# Patient Record
Sex: Female | Born: 1975 | Race: Black or African American | Hispanic: No | Marital: Single | Smoking: Never smoker
Health system: Southern US, Community
[De-identification: ages and names within clinical notes are randomized; demographics above are authoritative.]

## PROBLEM LIST (undated history)

## (undated) DIAGNOSIS — F329 Major depressive disorder, single episode, unspecified: Secondary | ICD-10-CM

## (undated) DIAGNOSIS — E079 Disorder of thyroid, unspecified: Secondary | ICD-10-CM

## (undated) DIAGNOSIS — F32A Depression, unspecified: Secondary | ICD-10-CM

## (undated) DIAGNOSIS — E669 Obesity, unspecified: Secondary | ICD-10-CM

## (undated) DIAGNOSIS — E559 Vitamin D deficiency, unspecified: Secondary | ICD-10-CM

## (undated) DIAGNOSIS — I10 Essential (primary) hypertension: Secondary | ICD-10-CM

## (undated) HISTORY — DX: Essential (primary) hypertension: I10

## (undated) HISTORY — DX: Depression, unspecified: F32.A

## (undated) HISTORY — PX: ABDOMINAL HYSTERECTOMY: SHX81

## (undated) HISTORY — DX: Vitamin D deficiency, unspecified: E55.9

## (undated) HISTORY — DX: Disorder of thyroid, unspecified: E07.9

## (undated) HISTORY — DX: Obesity, unspecified: E66.9

---
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
## 2019-07-19 ENCOUNTER — Encounter: Payer: Medicaid Other | Attending: Physician Assistant | Admitting: Dietician

## 2019-07-19 ENCOUNTER — Encounter: Payer: Self-pay | Admitting: Dietician

## 2019-07-19 ENCOUNTER — Other Ambulatory Visit: Payer: Self-pay

## 2019-07-19 DIAGNOSIS — E669 Obesity, unspecified: Secondary | ICD-10-CM | POA: Diagnosis present

## 2019-07-19 NOTE — Patient Instructions (Addendum)
   Aim to drink at least 64 ounces of fluid per day.   Review the Thyroid Health handout from today and be sure to include foods that promote thyroid health, and try to avoid foods that may decrease thyroid health.   If you are hungry in the evening, use the Sleep & Late-Night Foods handout for ideas of snacks to have. Also, use the tips on this sheet to promote better sleep which can help support your immune system, weight management, and overall health!  Avoid using salt and instead use the seasonings handout for low sodium flavor ideas. Avoid fast food, restaurant foods, and packaged foods as much as possible to also decrease salt intake.    Take a vitamin D supplement of 2000 IU every day.

## 2019-07-19 NOTE — Progress Notes (Signed)
Medical Nutrition Therapy  Appt Start Time: 2:05pm    End Time: 3:05pm  Primary concerns today: hypothyroidism, weight management, high blood pressure Referral diagnosis: E66.9- obesity Preferred learning style: no preference indicated Learning readiness: ready   NUTRITION ASSESSMENT   Clinical Medical Hx: obesity, hypothyroidism, vitamin D deficiency, HTN, depression Labs:  TG  160mg /dL (high)  LDL  100mg /dL (high)   T4  (low)  TSH  17 uIU/mL (high)  Vit D  16.1 ng/mL (low)  Notable Signs/Symptoms: puffiness around eyes, tiredness   Lifestyle & Dietary Hx Patient states she is quite stressed due to 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 States she does not sleep well at night, as she wakes up around 3am every night for about 2 hours before going back to sleep. States she does not eat if she is not hungry and often forgets to eat due to this, so typical meal pattern is 2-3 meals/snacks per day. States she was told to take vitamin D due to her deficiency and asked about appropriate supplements for this. States she is concerned about her weight and high blood pressure as well. Takes black seed oil, magnesium, zinc. States she has had issues getting labs drawn at the past few attempts due to dehydration. States she is always thirsty.   Estimated daily fluid intake: maybe 48 oz  Supplements: black seed oil, magnesium, zinc Sleep: "okay," 7 hours, but interrupted  Stress / self-care: very stressed, thinks this is root of many problems Current average weekly physical activity: ADLs  24-Hr Dietary Recall First Meal: cereal  Snack: - Second Meal: - Snack: chips Third Meal:  tuna fish sandwich Snack: - Beverages: water, cranberry juice, lemon water  Estimated Energy Needs Calories: 1800 Carbohydrate: 200g Protein: 113g Fat: 60g   NUTRITION DIAGNOSIS  Inadequate fluid intake (NI-3.1) related to lack of knowledge about appropriate fluid intake as evidenced by  thirst, tiredness, inability to have blood drawn due to dehydration, and reported fluid intake less than estimated needs.    NUTRITION INTERVENTION  Nutrition education (E-1) on the following topics:  . Fluids: amount per day, strategies to increase intake, importance (for thyroid health, weight management, energy, heart health, etc.)  . Thyroid health: functions, symptoms of dysfunction, foods/nutrients to consume and avoid, lifestyle strategies (such as stress management)  . Sleep: importance, strategies to promote sleep hygiene, food choices for evenings/nights  . Salt: tips to how to avoid excess intake, low-sodium seasoning options  Handouts Provided Include   Thyroid Health Tips  Sleep & Late-Night Eating   Low-Sodium Flavor Options   Learning Style & Readiness for Change Teaching method utilized: Visual & Auditory  Demonstrated degree of understanding via: Teach Back  Barriers to learning/adherence to lifestyle change: None Identified   Goals Established by Pt . Aim to drink at least 64 ounces per day  . Focus on consuming foods that support thyroid health and avoid certain foods/nutrients that may harm thyroid . Practice good sleep hygiene and stress management  . Try low sodium flavorings instead of salt, and try to avoid salty foods   MONITORING & EVALUATION Dietary intake, weekly physical activity, and goals in 6 weeks.  Next Steps  Patient is to return to [facility] for follow up visit in about 6 weeks.

## 2019-08-24 DIAGNOSIS — E7800 Pure hypercholesterolemia, unspecified: Secondary | ICD-10-CM | POA: Insufficient documentation

## 2019-08-24 DIAGNOSIS — E559 Vitamin D deficiency, unspecified: Secondary | ICD-10-CM | POA: Insufficient documentation

## 2019-08-24 DIAGNOSIS — E039 Hypothyroidism, unspecified: Secondary | ICD-10-CM | POA: Insufficient documentation

## 2019-08-24 DIAGNOSIS — E669 Obesity, unspecified: Secondary | ICD-10-CM | POA: Insufficient documentation

## 2019-08-30 ENCOUNTER — Encounter: Payer: Medicaid Other | Attending: Physician Assistant | Admitting: Dietician

## 2019-08-30 DIAGNOSIS — E669 Obesity, unspecified: Secondary | ICD-10-CM | POA: Insufficient documentation

## 2019-09-14 ENCOUNTER — Encounter (HOSPITAL_COMMUNITY): Payer: Self-pay | Admitting: *Deleted

## 2019-09-14 ENCOUNTER — Emergency Department (HOSPITAL_COMMUNITY): Payer: Medicaid Other

## 2019-09-14 ENCOUNTER — Emergency Department (HOSPITAL_COMMUNITY)
Admission: EM | Admit: 2019-09-14 | Discharge: 2019-09-14 | Disposition: A | Discharge status: Home / Self Care | Payer: Medicaid Other | Attending: Emergency Medicine | Admitting: Emergency Medicine

## 2019-09-14 DIAGNOSIS — I10 Essential (primary) hypertension: Secondary | ICD-10-CM | POA: Insufficient documentation

## 2019-09-14 DIAGNOSIS — R42 Dizziness and giddiness: Secondary | ICD-10-CM | POA: Insufficient documentation

## 2019-09-14 DIAGNOSIS — N3000 Acute cystitis without hematuria: Secondary | ICD-10-CM | POA: Diagnosis not present

## 2019-09-14 DIAGNOSIS — R0602 Shortness of breath: Secondary | ICD-10-CM | POA: Insufficient documentation

## 2019-09-14 LAB — URINALYSIS, ROUTINE W REFLEX MICROSCOPIC
Bilirubin Urine: NEGATIVE
Glucose, UA: NEGATIVE mg/dL
Hgb urine dipstick: NEGATIVE
Ketones, ur: NEGATIVE mg/dL
Nitrite: POSITIVE — AB
Protein, ur: NEGATIVE mg/dL
Specific Gravity, Urine: 1.009 (ref 1.005–1.030)
pH: 7 (ref 5.0–8.0)

## 2019-09-14 LAB — BASIC METABOLIC PANEL
Anion gap: 12 (ref 5–15)
BUN: 10 mg/dL (ref 6–20)
CO2: 29 mmol/L (ref 22–32)
Calcium: 9.3 mg/dL (ref 8.9–10.3)
Chloride: 98 mmol/L (ref 98–111)
Creatinine, Ser: 0.98 mg/dL (ref 0.44–1.00)
GFR calc Af Amer: >60 mL/min (ref >60)
GFR calc non Af Amer: >60 mL/min (ref >60)
Glucose, Bld: 104 mg/dL — ABNORMAL HIGH (ref 70–99)
Potassium: 3.3 mmol/L — ABNORMAL LOW (ref 3.5–5.1)
Sodium: 139 mmol/L (ref 135–145)

## 2019-09-14 LAB — CBC WITH DIFFERENTIAL/PLATELET
Abs Immature Granulocytes: 0.04 10*3/uL (ref 0.00–0.07)
Basophils Absolute: 0 10*3/uL (ref 0.0–0.1)
Basophils Relative: 0 %
Eosinophils Absolute: 0.1 10*3/uL (ref 0.0–0.5)
Eosinophils Relative: 1 %
HCT: 35.5 % — ABNORMAL LOW (ref 36.0–46.0)
Hemoglobin: 11.7 g/dL — ABNORMAL LOW (ref 12.0–15.0)
Immature Granulocytes: 1 %
Lymphocytes Relative: 21 %
Lymphs Abs: 1.4 10*3/uL (ref 0.7–4.0)
MCH: 29.3 pg (ref 26.0–34.0)
MCHC: 33 g/dL (ref 30.0–36.0)
MCV: 88.8 fL (ref 80.0–100.0)
Monocytes Absolute: 0.7 10*3/uL (ref 0.1–1.0)
Monocytes Relative: 10 %
Neutro Abs: 4.7 10*3/uL (ref 1.7–7.7)
Neutrophils Relative %: 67 %
Platelets: 315 10*3/uL (ref 150–400)
RBC: 4 MIL/uL (ref 3.87–5.11)
RDW: 12.7 % (ref 11.5–15.5)
WBC: 6.9 10*3/uL (ref 4.0–10.5)
nRBC: 0 % (ref 0.0–0.2)

## 2019-09-14 LAB — TROPONIN I (HIGH SENSITIVITY)
Troponin I (High Sensitivity): <2 ng/L (ref <18)
Troponin I (High Sensitivity): <2 ng/L (ref <18)

## 2019-09-14 LAB — MAGNESIUM: Magnesium: 1.9 mg/dL (ref 1.7–2.4)

## 2019-09-14 MED ORDER — ACETAMINOPHEN 325 MG PO TABS
650.0000 mg | ORAL_TABLET | Freq: Once | ORAL | Status: AC
  Administered 2019-09-14: 650 mg via ORAL
  Filled 2019-09-14: qty 2

## 2019-09-14 MED ORDER — CEPHALEXIN 500 MG PO CAPS
500.0000 mg | ORAL_CAPSULE | Freq: Three times a day (TID) | ORAL | 0 refills | Status: AC
Start: 2019-09-14 — End: ?

## 2019-09-14 NOTE — ED Triage Notes (Signed)
[facility] transported pt from home to [facility] ED and reports the following:  Pt called EMS for shortness of breath and weakness. Lungs are clear, vitals within normal ranges, no temperature. No known COVID exposure. Not vaccinated.

## 2019-09-14 NOTE — Discharge Instructions (Signed)
Please read and follow all provided instructions.  Your diagnoses today include:  1. Shortness of breath   2. Lightheadedness   3. Acute cystitis without hematuria     Tests performed today include:  An EKG of your heart - no problems  A chest x-ray - it is clear, no pneumonia  Cardiac enzymes - a blood test for heart muscle damage are both normal  Blood counts and electrolytes   Urine test - shows signs of urine infection  Vital signs. See below for your results today.   Medications prescribed:   Keflex (cephalexin) - antibiotic  You have been prescribed an antibiotic medicine: take the entire course of medicine even if you are feeling better. Stopping early can cause the antibiotic not to work.  Take any prescribed medications only as directed.  Follow-up instructions: Please follow-up with your primary care provider as soon as you can for further evaluation of your symptoms.   Return instructions:  SEEK IMMEDIATE MEDICAL ATTENTION IF:  You have severe chest pain, especially if the pain is crushing or pressure-like and spreads to the arms, back, neck, or jaw, or if you have sweating, nausea (feeling sick to your stomach), or shortness of breath. THIS IS AN EMERGENCY. Don't wait to see if the pain will go away. Get medical help at once. Call 911 or 0 (operator). DO NOT drive yourself to the hospital.   Your symptoms get worse and does not go away with rest.   You wake from sleep with chest pain or shortness of breath.  You feel dizzy or faint.  You have chest pain not typical of your usual pain for which you originally saw your caregiver.   You have any other emergent concerns regarding your health.  Your vital signs today were: BP (!) 175/113   Pulse 82   Temp 98.1 F (36.7 C) (Oral)   Resp 19   SpO2 100%  If your blood pressure (BP) was elevated above 135/85 this visit, please have this repeated by your doctor within one month. --------------

## 2019-09-14 NOTE — ED Provider Notes (Signed)
[facility]-EMERGENCY DEPT Provider Note   CSN: [id number] Arrival date & time: 09/14/19  1117     History Chief Complaint  Patient presents with  . Shortness of Breath    Gail Osborne is a 44 y.o. female.  Patient with history of hypothyroidism presents to the emergency department today for acute onset of shortness of breath.  Patient states that she has been having a tight sensation in her left upper arm and shoulder for the past several days.  She has been applying topical magnesium cream to this area.  Patient states that she was getting ready to clean today and felt acutely lightheaded like she was going to pass out.  She sat down and felt very short of breath.  No cough, fever or central chest pain.  She continues to have the tightness in her left upper extremity.  No abdominal pain.  No associated vomiting or diaphoresis.  She has a history of hypertension for which she is prescribed lisinopril at [facility].  She states that she has had high cholesterol in the past was able to come off medications after diet adjustments.  No diabetes, tobacco use, strong family history of coronary artery disease.  States mother just recently had a stroke. Patient denies risk factors for pulmonary embolism including: unilateral leg swelling, history of DVT/PE/other blood clots, use of exogenous hormones, recent immobilizations, recent surgery, recent travel (>4hr segment), malignancy, hemoptysis.         Past Medical History:  Diagnosis Date  . Depression   . Hypertension   . Obesity   . Thyroid disease   . Vitamin D deficiency     There are no problems to display for this patient.   Past Surgical History:  Procedure Laterality Date  . ABDOMINAL HYSTERECTOMY    . CESAREAN SECTION       OB History   No obstetric history on file.     Family History  Problem Relation Age of Onset  . Hyperlipidemia Mother   . Depression Mother   . Anxiety disorder  Mother   . Diabetes Father     Social History   Tobacco Use  . Smoking status: Unknown If Ever Smoked  Substance Use Topics  . Alcohol use: Not Currently  . Drug use: Not on file    Home Medications Prior to Admission medications   Not on File    Allergies    Patient has no known allergies.  Review of Systems   Review of Systems  Constitutional: Negative for fever.  HENT: Negative for rhinorrhea and sore throat.   Eyes: Negative for redness.  Respiratory: Positive for shortness of breath. Negative for cough.   Cardiovascular: Negative for chest pain.  Gastrointestinal: Negative for abdominal pain, diarrhea, nausea and vomiting.  Genitourinary: Negative for dysuria.  Musculoskeletal: Positive for myalgias.  Skin: Negative for rash.  Neurological: Positive for light-headedness. Negative for syncope and headaches.    Physical Exam Updated Vital Signs BP (!) 170/118 (BP Location: Right Arm)   Pulse 86   Temp 98.1 F (36.7 C) (Oral)   Resp 12   SpO2 100%   Physical Exam Vitals and nursing note reviewed.  Constitutional:      Appearance: She is well-developed.  HENT:     Head: Normocephalic and atraumatic.  Eyes:     General:        Right eye: No discharge.        Left eye: No discharge.  Conjunctiva/sclera: Conjunctivae normal.  Cardiovascular:     Rate and Rhythm: Normal rate and regular rhythm.     Heart sounds: Normal heart sounds.  Pulmonary:     Effort: Pulmonary effort is normal.     Breath sounds: Normal breath sounds. No decreased breath sounds.  Abdominal:     Palpations: Abdomen is soft.     Tenderness: There is no abdominal tenderness.  Musculoskeletal:     Cervical back: Normal range of motion and neck supple.     Right lower leg: No tenderness. No edema.     Left lower leg: No tenderness. No edema.     Comments: No clinical signs and symptoms of DVT.  Skin:    General: Skin is warm and dry.  Neurological:     Mental Status: She is  alert.  Psychiatric:        Mood and Affect: Mood is anxious.     ED Results / Procedures / Treatments   Labs (all labs ordered are listed, but only abnormal results are displayed) Labs Reviewed  CBC WITH DIFFERENTIAL/PLATELET - Abnormal; Notable for the following components:      Result Value   Hemoglobin 11.7 (*)    HCT 35.5 (*)    All other components within normal limits  BASIC METABOLIC PANEL - Abnormal; Notable for the following components:   Potassium 3.3 (*)    Glucose, Bld 104 (*)    All other components within normal limits  URINALYSIS, ROUTINE W REFLEX MICROSCOPIC - Abnormal; Notable for the following components:   APPearance HAZY (*)    Nitrite POSITIVE (*)    Leukocytes,Ua MODERATE (*)    Bacteria, UA MANY (*)    All other components within normal limits  MAGNESIUM  TROPONIN I (HIGH SENSITIVITY)  TROPONIN I (HIGH SENSITIVITY)    ED ECG REPORT   Date: 09/14/2019  Rate: 79  Rhythm: normal sinus rhythm  QRS Axis: normal  Intervals: normal  ST/T Wave abnormalities: normal  Conduction Disutrbances:none  Narrative Interpretation:   Old EKG Reviewed: none available  I have personally reviewed the EKG tracing and agree with the computerized printout as noted.  Radiology No results found.  Procedures Procedures (including critical care time)  Medications Ordered in ED Medications - No data to display  ED Course  I have reviewed the triage vital signs and the nursing notes.  Pertinent labs & imaging results that were available during my care of the patient were reviewed by me and considered in my medical decision making (see chart for details).  Patient seen and examined. Work-up initiated. Medications ordered.   Vital signs reviewed and are as follows: BP (!) 170/118 (BP Location: Right Arm)   Pulse 86   Temp 98.1 F (36.7 C) (Oral)   Resp 12   SpO2 100%   12:44 PM CXR/EKG reviewed.   3:26 PM lab work appears been reassuring.  Patient is  resting comfortably in bed.  We reviewed results.  Patient be treated for UTI with Keflex.  Prescription was sent to her pharmacy.  Strongly encourage PCP follow-up.  Encouraged return the emergency department with worsening pain, persistent shortness of breath, fever, new symptoms or other concerns.   MDM Rules/Calculators/A&P                          Patient with episodic shortness of breath and lightheadedness today.  She has had some tightness in her arm but no chest pain.  EKG nonischemic and without significant abnormality.  Troponin negative x2.  Chest x-ray is clear.  Do not suspect thyroid emergency.  Patient does not have clinical signs and symptoms of DVT and is not tachycardic or hypoxic.  No signs of pneumonia or infection on her x-ray.  No indications for admission at this time.  She will need her blood pressure rechecked by her primary care doctor.  She will continue to take her lisinopril until that time.  She was found to have cloudy urine and UTI with positive nitrite on UA. PERC neg and low-risk HEART score.     Final Clinical Impression(s) / ED Diagnoses Final diagnoses:  Shortness of breath  Lightheadedness  Acute cystitis without hematuria  Essential hypertension    Rx / DC Orders ED Discharge Orders         Ordered    cephALEXin (KEFLEX) 500 MG capsule  3 times daily     Discontinue  Reprint     09/14/19 1521           Renne Crigler, PA-C 09/14/19 1528    Alvira Monday, MD 09/15/19 [id number]

## 2019-11-22 ENCOUNTER — Emergency Department (HOSPITAL_COMMUNITY)
Admission: EM | Admit: 2019-11-22 | Discharge: 2019-11-22 | Disposition: A | Discharge status: Home / Self Care | Payer: Medicaid Other | Attending: Emergency Medicine | Admitting: Emergency Medicine

## 2019-11-22 ENCOUNTER — Emergency Department (HOSPITAL_COMMUNITY): Payer: Medicaid Other

## 2019-11-22 ENCOUNTER — Other Ambulatory Visit: Payer: Self-pay

## 2019-11-22 ENCOUNTER — Encounter (HOSPITAL_COMMUNITY): Payer: Self-pay | Admitting: Pediatrics

## 2019-11-22 DIAGNOSIS — E039 Hypothyroidism, unspecified: Secondary | ICD-10-CM | POA: Insufficient documentation

## 2019-11-22 DIAGNOSIS — K5900 Constipation, unspecified: Secondary | ICD-10-CM | POA: Insufficient documentation

## 2019-11-22 DIAGNOSIS — Z5321 Procedure and treatment not carried out due to patient leaving prior to being seen by health care provider: Secondary | ICD-10-CM | POA: Insufficient documentation

## 2019-11-22 DIAGNOSIS — R202 Paresthesia of skin: Secondary | ICD-10-CM | POA: Insufficient documentation

## 2019-11-22 LAB — CBC
HCT: 37.8 % (ref 36.0–46.0)
Hemoglobin: 12.6 g/dL (ref 12.0–15.0)
MCH: 29.7 pg (ref 26.0–34.0)
MCHC: 33.3 g/dL (ref 30.0–36.0)
MCV: 89.2 fL (ref 80.0–100.0)
Platelets: 370 10*3/uL (ref 150–400)
RBC: 4.24 MIL/uL (ref 3.87–5.11)
RDW: 13.2 % (ref 11.5–15.5)
WBC: 10.1 10*3/uL (ref 4.0–10.5)

## 2019-11-22 LAB — DIFFERENTIAL
Abs Immature Granulocytes: 0 10*3/uL (ref 0.00–0.07)
Basophils Absolute: 0 10*3/uL (ref 0.0–0.1)
Basophils Relative: 0 %
Eosinophils Absolute: 0.1 10*3/uL (ref 0.0–0.5)
Eosinophils Relative: 1 %
Immature Granulocytes: 0 %
Lymphocytes Relative: 14 %
Lymphs Abs: 1.4 10*3/uL (ref 0.7–4.0)
Monocytes Absolute: 0.8 10*3/uL (ref 0.1–1.0)
Monocytes Relative: 8 %
Neutro Abs: 7.7 10*3/uL (ref 1.7–7.7)
Neutrophils Relative %: 77 %

## 2019-11-22 LAB — COMPREHENSIVE METABOLIC PANEL
ALT: 17 U/L (ref 0–44)
AST: 21 U/L (ref 15–41)
Albumin: 3.8 g/dL (ref 3.5–5.0)
Alkaline Phosphatase: 51 U/L (ref 38–126)
Anion gap: 11 (ref 5–15)
BUN: 11 mg/dL (ref 6–20)
CO2: 24 mmol/L (ref 22–32)
Calcium: 9.4 mg/dL (ref 8.9–10.3)
Chloride: 98 mmol/L (ref 98–111)
Creatinine, Ser: 1.14 mg/dL — ABNORMAL HIGH (ref 0.44–1.00)
GFR calc Af Amer: >60 mL/min (ref >60)
GFR calc non Af Amer: 58 mL/min — ABNORMAL LOW (ref >60)
Glucose, Bld: 140 mg/dL — ABNORMAL HIGH (ref 70–99)
Potassium: 3.4 mmol/L — ABNORMAL LOW (ref 3.5–5.1)
Sodium: 133 mmol/L — ABNORMAL LOW (ref 135–145)
Total Bilirubin: 1 mg/dL (ref 0.3–1.2)
Total Protein: 7.5 g/dL (ref 6.5–8.1)

## 2019-11-22 LAB — I-STAT CHEM 8, ED
BUN: 11 mg/dL (ref 6–20)
Calcium, Ion: 1.14 mmol/L — ABNORMAL LOW (ref 1.15–1.40)
Chloride: 99 mmol/L (ref 98–111)
Creatinine, Ser: 1 mg/dL (ref 0.44–1.00)
Glucose, Bld: 140 mg/dL — ABNORMAL HIGH (ref 70–99)
HCT: 36 % (ref 36.0–46.0)
Hemoglobin: 12.2 g/dL (ref 12.0–15.0)
Potassium: 3.3 mmol/L — ABNORMAL LOW (ref 3.5–5.1)
Sodium: 137 mmol/L (ref 135–145)
TCO2: 24 mmol/L (ref 22–32)

## 2019-11-22 LAB — APTT: aPTT: 28 seconds (ref 24–36)

## 2019-11-22 LAB — PROTIME-INR
INR: 1 (ref 0.8–1.2)
Prothrombin Time: 12.5 seconds (ref 11.4–15.2)

## 2019-11-22 LAB — I-STAT BETA HCG BLOOD, ED (MC, WL, AP ONLY): I-stat hCG, quantitative: <5 m[IU]/mL (ref <5)

## 2019-11-22 MED ORDER — SODIUM CHLORIDE 0.9% FLUSH
3.0000 mL | Freq: Once | INTRAVENOUS | Status: DC
Start: 2019-11-22 — End: 2019-11-23

## 2019-11-22 NOTE — ED Triage Notes (Signed)
C/O bilateral numbness / tingling sensation on lower extremities x 2 weeks. Patient also c/o constipation x 5 days. Stated hx of hypothyroidism, high blood pressure and cholesterol.

## 2019-11-22 NOTE — ED Notes (Signed)
Patient states she has to be home by 11pm d/t where she lives. LWBS

## 2019-11-23 ENCOUNTER — Encounter (HOSPITAL_COMMUNITY): Payer: Self-pay

## 2019-11-23 ENCOUNTER — Emergency Department (HOSPITAL_COMMUNITY)
Admission: EM | Admit: 2019-11-23 | Discharge: 2019-11-23 | Disposition: A | Discharge status: Home / Self Care | Payer: Medicaid Other | Attending: Emergency Medicine | Admitting: Emergency Medicine

## 2019-11-23 ENCOUNTER — Other Ambulatory Visit: Payer: Self-pay

## 2019-11-23 DIAGNOSIS — E039 Hypothyroidism, unspecified: Secondary | ICD-10-CM | POA: Insufficient documentation

## 2019-11-23 DIAGNOSIS — I10 Essential (primary) hypertension: Secondary | ICD-10-CM | POA: Diagnosis not present

## 2019-11-23 MED ORDER — LISINOPRIL 10 MG PO TABS
10.0000 mg | ORAL_TABLET | Freq: Every day | ORAL | Status: DC
  Administered 2019-11-23: 10 mg via ORAL
  Filled 2019-11-23: qty 1

## 2019-11-23 MED ORDER — LISINOPRIL 10 MG PO TABS: 10.0000 mg | ORAL_TABLET | Freq: Every day | ORAL | 2 refills | Status: AC

## 2019-11-23 MED ORDER — LEVOTHYROXINE SODIUM 112 MCG PO TABS
112.0000 ug | ORAL_TABLET | Freq: Every day | ORAL | Status: DC
  Administered 2019-11-23: 112 ug via ORAL
  Filled 2019-11-23: qty 1

## 2019-11-23 MED ORDER — LEVOTHYROXINE SODIUM 112 MCG PO TABS: 112.0000 ug | ORAL_TABLET | Freq: Every day | ORAL | 2 refills | Status: AC

## 2019-11-23 NOTE — Discharge Instructions (Addendum)
We sent your prescriptions to the [facility].  Make sure you pick the prescriptions up and start taking the medications today.  This should improve your difficulty walking.

## 2019-11-23 NOTE — ED Triage Notes (Signed)
Patient c/o hypertension, blurred vision and an intermittent headache. Patient states she has been out of her Lisinopril x 1 month. BP in triage- 159/113. Patient states she was at [facility] ED yesterday and was seen for the same.

## 2019-11-23 NOTE — ED Provider Notes (Signed)
[facility]-EMERGENCY DEPT Provider Note   CSN: [id number] Arrival date & time: 11/23/19  1134     History Chief Complaint  Patient presents with  . Hypertension    Gail Osborne is a 44 y.o. female.  HPI She presents for evaluation of generalized weakness which makes it hard to walk, numbness of her feet, and being out of her medications for several weeks.  She is supposed to be taking lisinopril and Synthroid, but has not gotten prescriptions recently.  She was in the ED, 3 months ago, for shortness of breath.  At that time she had her medications.  She denies headache, dizziness, focal weakness, chest pain, shortness of breath, blurred vision, nausea or vomiting.  There are no other known modifying factors.    Past Medical History:  Diagnosis Date  . Depression   . Hypertension   . Obesity   . Thyroid disease   . Vitamin D deficiency     Patient Active Problem List   Diagnosis Date Noted  . Acquired hypothyroidism 08/24/2019  . Elevated LDL cholesterol level 08/24/2019  . Obesity (BMI 35.0-39.9 without comorbidity) 08/24/2019  . Vitamin D deficiency 08/24/2019    Past Surgical History:  Procedure Laterality Date  . ABDOMINAL HYSTERECTOMY     partial  . CESAREAN SECTION       OB History   No obstetric history on file.     Family History  Problem Relation Age of Onset  . Hyperlipidemia Mother   . Depression Mother   . Anxiety disorder Mother   . Diabetes Father     Social History   Tobacco Use  . Smoking status: Never Smoker  . Smokeless tobacco: Never Used  Vaping Use  . Vaping Use: Never used  Substance Use Topics  . Alcohol use: Not Currently  . Drug use: Never    Home Medications Prior to Admission medications   Medication Sig Start Date End Date Taking? Authorizing Provider  cephALEXin (KEFLEX) 500 MG capsule Take 1 capsule (500 mg total) by mouth 3 (three) times daily. Patient not taking: Reported on 11/23/2019 09/14/19    Renne Crigler, PA-C  levothyroxine (SYNTHROID) 112 MCG tablet Take 1 tablet (112 mcg total) by mouth daily. 11/23/19   Mancel Bale, MD  lisinopril (ZESTRIL) 10 MG tablet Take 1 tablet (10 mg total) by mouth daily. 11/23/19   Mancel Bale, MD    Allergies    Patient has no known allergies.  Review of Systems   Review of Systems  All other systems reviewed and are negative.   Physical Exam Updated Vital Signs BP (!) 148/106 (BP Location: Right Arm)   Pulse (!) 101   Temp 98 F (36.7 C) (Oral)   Resp 16   Ht 5\' 4"  (1.626 m)   Wt 102.1 kg   SpO2 100%   BMI 38.62 kg/m   Physical Exam Vitals and nursing note reviewed.  Constitutional:      General: She is not in acute distress.    Appearance: She is well-developed. She is obese. She is not ill-appearing, toxic-appearing or diaphoretic.  HENT:     Head: Normocephalic and atraumatic.  Eyes:     Conjunctiva/sclera: Conjunctivae normal.     Pupils: Pupils are equal, round, and reactive to light.  Neck:     Trachea: Phonation normal.  Cardiovascular:     Rate and Rhythm: Normal rate and regular rhythm.     Heart sounds: Normal heart sounds.  Pulmonary:  Effort: Pulmonary effort is normal.     Breath sounds: Normal breath sounds.  Chest:     Chest wall: No tenderness.  Abdominal:     General: There is no distension.     Palpations: Abdomen is soft.  Musculoskeletal:        General: Normal range of motion.     Cervical back: Normal range of motion and neck supple.     Comments: Shuffling gait, normal strength arms and legs bilaterally.  Skin:    General: Skin is warm and dry.  Neurological:     Mental Status: She is alert and oriented to person, place, and time.     Motor: No abnormal muscle tone.     Comments: no dysarthria or aphasia.  No ataxia.  Psychiatric:        Mood and Affect: Mood normal.        Behavior: Behavior normal.        Thought Content: Thought content normal.        Judgment: Judgment  normal.     ED Results / Procedures / Treatments   Labs (all labs ordered are listed, but only abnormal results are displayed) Labs Reviewed - No data to display  EKG None  Radiology CT HEAD WO CONTRAST  Result Date: 11/22/2019 CLINICAL DATA:  Neuro deficit, acute stroke suspected EXAM: CT HEAD WITHOUT CONTRAST TECHNIQUE: Contiguous axial images were obtained from the base of the skull through the vertex without intravenous contrast. COMPARISON:  None. FINDINGS: Brain: No evidence of acute infarction, hemorrhage, hydrocephalus, extra-axial collection or mass lesion/mass effect. Basal cisterns are patent. Slightly expanded, CSF filled appearance of the sella. Remaining midline intracranial structures are unremarkable. Cerebellar tonsils are normally positioned. Vascular: No hyperdense vessel or unexpected calcification. Skull: No calvarial fracture or suspicious osseous lesion. No scalp swelling or hematoma. Sinuses/Orbits: Paranasal sinuses and mastoid air cells are predominantly clear. Included orbital structures are unremarkable. Other: None. IMPRESSION: 1. No acute intracranial abnormality. If there is persisting clinical concern for infarct, MRI is more sensitive and specific for early changes of ischemia. 2. Slightly expanded, CSF filled appearance of the sella, compatible with partially empty sella. Nonspecific but can be seen in the setting of idiopathic intracranial hypertension. Electronically Signed   By: Kreg Shropshire M.D.   On: 11/22/2019 19:25    Procedures Procedures (including critical care time)  Medications Ordered in ED Medications  levothyroxine (SYNTHROID) tablet 112 mcg (has no administration in time range)  lisinopril (ZESTRIL) tablet 10 mg (has no administration in time range)    ED Course  I have reviewed the triage vital signs and the nursing notes.  Pertinent labs & imaging results that were available during my care of the patient were reviewed by me and  considered in my medical decision making (see chart for details).    MDM Rules/Calculators/A&P                           Patient Vitals for the past 24 hrs:  BP Temp Temp src Pulse Resp SpO2 Height Weight  11/23/19 1316 (!) 148/106 -- -- -- -- -- -- --  11/23/19 1146 -- -- -- -- -- -- 5\' 4"  (1.626 m) 102.1 kg  11/23/19 1142 (!) 159/113 98 F (36.7 C) Oral (!) 101 16 100 % -- --    2:30 PM Reevaluation with update and discussion. After initial assessment and treatment, an updated evaluation reveals no further complaints.  She is comfortable.  Findings discussed with the patient all questions were answered. Mancel BaleElliott Nishtha Raider   Medical Decision Making:  This patient is presenting for evaluation of difficulty walking and numb feet bilaterally., which does require a range of treatment options, and is a complaint that involves a moderate risk of morbidity and mortality. The differential diagnoses include acute brain disorder, spinal lesion, metabolic abnormalities, nonspecific malaise. I decided to review old records, and in summary middle-aged female, who is not taking prescribed medications for hypertension and hypothyroidism.  She had evaluation, yesterday, CT brain, and labs but no provider encounter.  I did not require additional historical information from anyone.    Results for orders placed or performed during the hospital encounter of 11/22/19  Protime-INR  Result Value Ref Range   Prothrombin Time 12.5 11.4 - 15.2 seconds   INR 1.0 0.8 - 1.2  APTT  Result Value Ref Range   aPTT 28 24 - 36 seconds  CBC  Result Value Ref Range   WBC 10.1 4.0 - 10.5 K/uL   RBC 4.24 3.87 - 5.11 MIL/uL   Hemoglobin 12.6 12.0 - 15.0 g/dL   HCT 16.137.8 36 - 46 %   MCV 89.2 80.0 - 100.0 fL   MCH 29.7 26.0 - 34.0 pg   MCHC 33.3 30.0 - 36.0 g/dL   RDW 09.613.2 04.511.5 - 40.915.5 %   Platelets 370 150 - 400 K/uL  Differential  Result Value Ref Range   Neutrophils Relative % 77 %   Neutro Abs 7.7 1.7 - 7.7 K/uL    Lymphocytes Relative 14 %   Lymphs Abs 1.4 0.7 - 4.0 K/uL   Monocytes Relative 8 %   Monocytes Absolute 0.8 0 - 1 K/uL   Eosinophils Relative 1 %   Eosinophils Absolute 0.1 0 - 0 K/uL   Basophils Relative 0 %   Basophils Absolute 0.0 0 - 0 K/uL   Immature Granulocytes 0 %   Abs Immature Granulocytes 0.00 0.00 - 0.07 K/uL  Comprehensive metabolic panel  Result Value Ref Range   Sodium 133 (L) 135 - 145 mmol/L   Potassium 3.4 (L) 3.5 - 5.1 mmol/L   Chloride 98 98 - 111 mmol/L   CO2 24 22 - 32 mmol/L   Glucose, Bld 140 (H) 70 - 99 mg/dL   BUN 11 6 - 20 mg/dL   Creatinine, Ser 8.111.14 (H) 0.44 - 1.00 mg/dL   Calcium 9.4 8.9 - 91.410.3 mg/dL   Total Protein 7.5 6.5 - 8.1 g/dL   Albumin 3.8 3.5 - 5.0 g/dL   AST 21 15 - 41 U/L   ALT 17 0 - 44 U/L   Alkaline Phosphatase 51 38 - 126 U/L   Total Bilirubin 1.0 0.3 - 1.2 mg/dL   GFR calc non Af Amer 58 (L) >60 mL/min   GFR calc Af Amer >60 >60 mL/min   Anion gap 11 5 - 15  I-stat chem 8, ED  Result Value Ref Range   Sodium 137 135 - 145 mmol/L   Potassium 3.3 (L) 3.5 - 5.1 mmol/L   Chloride 99 98 - 111 mmol/L   BUN 11 6 - 20 mg/dL   Creatinine, Ser 7.821.00 0.44 - 1.00 mg/dL   Glucose, Bld 956140 (H) 70 - 99 mg/dL   Calcium, Ion 2.131.14 (L) 1.15 - 1.40 mmol/L   TCO2 24 22 - 32 mmol/L   Hemoglobin 12.2 12.0 - 15.0 g/dL   HCT 08.636.0 36 - 46 %  I-Stat  beta hCG blood, ED  Result Value Ref Range   I-stat hCG, quantitative <5.0 <5 mIU/mL   Comment 3           CT HEAD WO CONTRAST  Result Date: 11/22/2019 CLINICAL DATA:  Neuro deficit, acute stroke suspected EXAM: CT HEAD WITHOUT CONTRAST TECHNIQUE: Contiguous axial images were obtained from the base of the skull through the vertex without intravenous contrast. COMPARISON:  None. FINDINGS: Brain: No evidence of acute infarction, hemorrhage, hydrocephalus, extra-axial collection or mass lesion/mass effect. Basal cisterns are patent. Slightly expanded, CSF filled appearance of the sella. Remaining midline  intracranial structures are unremarkable. Cerebellar tonsils are normally positioned. Vascular: No hyperdense vessel or unexpected calcification. Skull: No calvarial fracture or suspicious osseous lesion. No scalp swelling or hematoma. Sinuses/Orbits: Paranasal sinuses and mastoid air cells are predominantly clear. Included orbital structures are unremarkable. Other: None. IMPRESSION: 1. No acute intracranial abnormality. If there is persisting clinical concern for infarct, MRI is more sensitive and specific for early changes of ischemia. 2. Slightly expanded, CSF filled appearance of the sella, compatible with partially empty sella. Nonspecific but can be seen in the setting of idiopathic intracranial hypertension. Electronically Signed   By: Kreg Shropshire M.D.   On: 11/22/2019 19:25     Critical Interventions-clinical evaluation, review of laboratory and radiographic results from yesterday, observation and reassessment  After These Interventions, the Patient was reevaluated and was found stable for discharge.  Patient with nonspecific symptoms, related to being off medications for low blood pressure and hypothyroidism.  Doubt hypertensive emergency or acute thyroid crisis.  No indication for further ED intervention   CRITICAL CARE- no Performed by: Mancel Bale  Nursing Notes Reviewed/ Care Coordinated Applicable Imaging Reviewed Interpretation of Laboratory Data incorporated into ED treatment  The patient appears reasonably screened and/or stabilized for discharge and I doubt any other medical condition or other [facility] requiring further screening, evaluation, or treatment in the ED at this time prior to discharge.  Plan: Home Medications-restart usual medicines as soon as possible; Home Treatments-rest, low-salt diet; return here if the recommended treatment, does not improve the symptoms; Recommended follow up-PCP checkup 1 week and as needed     Final Clinical Impression(s) / ED  Diagnoses Final diagnoses:  Hypertension, unspecified type  Hypothyroidism, unspecified type    Rx / DC Orders ED Discharge Orders         Ordered    levothyroxine (SYNTHROID) 112 MCG tablet  Daily        11/23/19 1426    lisinopril (ZESTRIL) 10 MG tablet  Daily        11/23/19 1426           Mancel Bale, MD 11/23/19 1430

## 2020-01-31 ENCOUNTER — Other Ambulatory Visit: Payer: Self-pay

## 2020-01-31 ENCOUNTER — Emergency Department (HOSPITAL_COMMUNITY)
Admission: EM | Admit: 2020-01-31 | Discharge: 2020-01-31 | Disposition: A | Discharge status: Home / Self Care | Payer: Medicaid Other | Attending: Radiology | Admitting: Radiology

## 2020-01-31 ENCOUNTER — Emergency Department (HOSPITAL_COMMUNITY): Payer: Medicaid Other

## 2020-01-31 ENCOUNTER — Encounter (HOSPITAL_COMMUNITY): Payer: Self-pay | Admitting: Emergency Medicine

## 2020-01-31 DIAGNOSIS — H4710 Unspecified papilledema: Secondary | ICD-10-CM | POA: Insufficient documentation

## 2020-01-31 DIAGNOSIS — E7800 Pure hypercholesterolemia, unspecified: Secondary | ICD-10-CM | POA: Diagnosis not present

## 2020-01-31 DIAGNOSIS — Z6835 Body mass index (BMI) 35.0-35.9, adult: Secondary | ICD-10-CM | POA: Diagnosis not present

## 2020-01-31 DIAGNOSIS — Z791 Long term (current) use of non-steroidal anti-inflammatories (NSAID): Secondary | ICD-10-CM | POA: Diagnosis not present

## 2020-01-31 DIAGNOSIS — E039 Hypothyroidism, unspecified: Secondary | ICD-10-CM | POA: Insufficient documentation

## 2020-01-31 DIAGNOSIS — Z818 Family history of other mental and behavioral disorders: Secondary | ICD-10-CM | POA: Insufficient documentation

## 2020-01-31 DIAGNOSIS — Z79899 Other long term (current) drug therapy: Secondary | ICD-10-CM | POA: Insufficient documentation

## 2020-01-31 DIAGNOSIS — E669 Obesity, unspecified: Secondary | ICD-10-CM | POA: Diagnosis not present

## 2020-01-31 DIAGNOSIS — Z8349 Family history of other endocrine, nutritional and metabolic diseases: Secondary | ICD-10-CM | POA: Diagnosis not present

## 2020-01-31 DIAGNOSIS — Z833 Family history of diabetes mellitus: Secondary | ICD-10-CM | POA: Diagnosis not present

## 2020-01-31 DIAGNOSIS — G932 Benign intracranial hypertension: Secondary | ICD-10-CM | POA: Diagnosis not present

## 2020-01-31 DIAGNOSIS — E559 Vitamin D deficiency, unspecified: Secondary | ICD-10-CM | POA: Insufficient documentation

## 2020-01-31 LAB — CBC
HCT: 35.1 % — ABNORMAL LOW (ref 36.0–46.0)
Hemoglobin: 11.9 g/dL — ABNORMAL LOW (ref 12.0–15.0)
MCH: 30.7 pg (ref 26.0–34.0)
MCHC: 33.9 g/dL (ref 30.0–36.0)
MCV: 90.7 fL (ref 80.0–100.0)
Platelets: 322 10*3/uL (ref 150–400)
RBC: 3.87 MIL/uL (ref 3.87–5.11)
RDW: 13.9 % (ref 11.5–15.5)
WBC: 7.6 10*3/uL (ref 4.0–10.5)
nRBC: 0 % (ref 0.0–0.2)

## 2020-01-31 LAB — CSF CELL COUNT WITH DIFFERENTIAL
Lymphs, CSF: 64 % (ref 40–80)
Monocyte-Macrophage-Spinal Fluid: 27 % (ref 15–45)
RBC Count, CSF: 3 /mm3 — ABNORMAL HIGH
Segmented Neutrophils-CSF: 9 % — ABNORMAL HIGH (ref 0–6)
Tube #: 4
WBC, CSF: 14 /mm3 (ref 0–5)

## 2020-01-31 LAB — BASIC METABOLIC PANEL
Anion gap: 8 (ref 5–15)
BUN: 6 mg/dL (ref 6–20)
CO2: 30 mmol/L (ref 22–32)
Calcium: 9.4 mg/dL (ref 8.9–10.3)
Chloride: 101 mmol/L (ref 98–111)
Creatinine, Ser: 0.79 mg/dL (ref 0.44–1.00)
GFR, Estimated: >60 mL/min (ref >60)
Glucose, Bld: 103 mg/dL — ABNORMAL HIGH (ref 70–99)
Potassium: 3.5 mmol/L (ref 3.5–5.1)
Sodium: 139 mmol/L (ref 135–145)

## 2020-01-31 LAB — PROTEIN, CSF: Total  Protein, CSF: 129 mg/dL — ABNORMAL HIGH (ref 15–45)

## 2020-01-31 LAB — GLUCOSE, CSF: Glucose, CSF: 38 mg/dL — ABNORMAL LOW (ref 40–70)

## 2020-01-31 LAB — I-STAT BETA HCG BLOOD, ED (MC, WL, AP ONLY): I-stat hCG, quantitative: <5 m[IU]/mL (ref <5)

## 2020-01-31 MED ORDER — LORAZEPAM 2 MG/ML IJ SOLN
1.0000 mg | Freq: Once | INTRAMUSCULAR | Status: AC | PRN
  Administered 2020-01-31: 1 mg via INTRAVENOUS
  Filled 2020-01-31: qty 1

## 2020-01-31 MED ORDER — ACETAZOLAMIDE ER 500 MG PO CP12: 500.0000 mg | ORAL_CAPSULE | Freq: Two times a day (BID) | ORAL | 0 refills | Status: AC

## 2020-01-31 MED ORDER — ACETAMINOPHEN 325 MG PO TABS
650.0000 mg | ORAL_TABLET | Freq: Once | ORAL | Status: AC
  Administered 2020-01-31: 650 mg via ORAL
  Filled 2020-01-31: qty 2

## 2020-01-31 NOTE — Progress Notes (Signed)
Patient was medicated prior to MRI; however, she still was unable to tolerate the procedure. Patient terminated the exam and refused imaging. Three MRI staff members in room when patient refused to continue.

## 2020-01-31 NOTE — ED Notes (Addendum)
Patient off the floor.

## 2020-01-31 NOTE — ED Notes (Addendum)
Date and time results received: 01/31/20 2017 (use smartphrase ".now" to insert current time)  Test:WBC Critical Value: 14  Name of Provider Notified:  Deretha Emory MD  Orders Received? Or Actions Taken?: no new orders

## 2020-01-31 NOTE — Procedures (Signed)
Successful fluoroscopically-guided L3-L4 lumbar puncture without immediate post-procedure complication.   Opening pressure of 39 cm water.  12 mL CSF collected and sent for laboratory studies.

## 2020-01-31 NOTE — ED Notes (Signed)
Patient off the unit for scan

## 2020-01-31 NOTE — ED Notes (Signed)
Transported to MRI via stretcher.

## 2020-01-31 NOTE — ED Notes (Signed)
Pt ambulated to bathroom 

## 2020-01-31 NOTE — ED Notes (Addendum)
Date and time results received: 01/31/20 2018 (use smartphrase ".now" to insert current time)  Test: Gram stain Critical Value: nothing seen Name of Provider Notified: Deretha Emory MD  Orders Received? Or Actions Taken?: no new orders

## 2020-01-31 NOTE — ED Provider Notes (Signed)
Patient was instructed to lay flat for 4 hours.  Patient feeling fine.  Patient's CSF fluid only significant for white blood cell of 14.  But patient's been having symptoms for over 2 weeks.  Seems to be definitely be associated with increased intracranial pressure.  Based on MRI MRA and then the opening pressure.  Patient without anything suggestive of any infection in the spinal fluid.  Patient's been referred to neurology for follow-up.  Patient stable for discharge home.  Patient denies any fevers.    Vanetta Mulders, MD 01/31/20 2110

## 2020-01-31 NOTE — Discharge Instructions (Addendum)
Follow-up with one of the neurology groups.  You will also need follow-up with the abnormalities on the MRI that they can help arrange.

## 2020-01-31 NOTE — ED Notes (Signed)
Pt transported to fluoroscopy via stretcher.

## 2020-01-31 NOTE — ED Triage Notes (Signed)
Pt reports that she had vision problems for a couple weeks but thought related to thyroid. Went to [facility] and had eye exam which sent to a specialist for optic nerve evaluation. Saw specialist yesterday who advised pt to go to ED for MRI.

## 2020-01-31 NOTE — ED Provider Notes (Signed)
[facility]-EMERGENCY DEPT Provider Note   CSN: [id number] Arrival date & time: 01/31/20  7026     History Chief Complaint  Patient presents with  . Eye Problem    Gail Osborne is a 44 y.o. female.  HPI Patient has been sent in from ophthalmology for bilateral optic disc edema.  Right worse than left.  States for like the last week she has had some vision changes particular in the right eye.  States she thought it was due to her thyroid.  States she had previously had some difficulty walking but not having problems with it now.  States she had been given some vitamins and it improved.  States she thought she just needed some glasses with the vision change.  Sent in for MRI with and without contrast of brain.  MRI with and without contrast of orbits.  Also MRV.  If negative may need IIH work-up potentially by neurology per ophthalmology and getting more of these.      Past Medical History:  Diagnosis Date  . Depression   . Hypertension   . Obesity   . Thyroid disease   . Vitamin D deficiency     Patient Active Problem List   Diagnosis Date Noted  . Acquired hypothyroidism 08/24/2019  . Elevated LDL cholesterol level 08/24/2019  . Obesity (BMI 35.0-39.9 without comorbidity) 08/24/2019  . Vitamin D deficiency 08/24/2019    Past Surgical History:  Procedure Laterality Date  . ABDOMINAL HYSTERECTOMY     partial  . CESAREAN SECTION       OB History   No obstetric history on file.     Family History  Problem Relation Age of Onset  . Hyperlipidemia Mother   . Depression Mother   . Anxiety disorder Mother   . Diabetes Father     Social History   Tobacco Use  . Smoking status: Never Smoker  . Smokeless tobacco: Never Used  Vaping Use  . Vaping Use: Never used  Substance Use Topics  . Alcohol use: Not Currently  . Drug use: Never    Home Medications Prior to Admission medications   Medication Sig Start Date End Date Taking?  Authorizing Provider  ibuprofen (ADVIL) 200 MG tablet Take 200 mg by mouth every 6 (six) hours as needed for fever, headache or mild pain.   Yes [provider]  levothyroxine (SYNTHROID) 112 MCG tablet Take 1 tablet (112 mcg total) by mouth daily. 11/23/19  Yes Mancel Bale, MD  lisinopril (ZESTRIL) 10 MG tablet Take 1 tablet (10 mg total) by mouth daily. 11/23/19  Yes Mancel Bale, MD  Vitamin D, Ergocalciferol, (DRISDOL) 1.25 MG (50000 UNIT) CAPS capsule Take 50,000 Units by mouth once a week. 12/19/19  Yes [provider]  acetaZOLAMIDE (DIAMOX SEQUELS) 500 MG capsule Take 1 capsule (500 mg total) by mouth 2 (two) times daily. 01/31/20   Benjiman Core, MD  cephALEXin (KEFLEX) 500 MG capsule Take 1 capsule (500 mg total) by mouth 3 (three) times daily. Patient not taking: Reported on 11/23/2019 09/14/19   Renne Crigler, PA-C    Allergies    Patient has no known allergies.  Review of Systems   Review of Systems  Constitutional: Negative for appetite change.  HENT: Negative for congestion.   Eyes: Positive for visual disturbance.  Respiratory: Negative for shortness of breath.   Cardiovascular: Negative for chest pain.  Gastrointestinal: Negative for abdominal pain.  Genitourinary: Negative for flank pain.  Musculoskeletal: Negative for back pain.  Skin: Negative for rash.  Neurological: Negative for weakness.  Hematological: Negative for adenopathy.  Psychiatric/Behavioral: Negative for confusion.    Physical Exam Updated Vital Signs BP (!) 158/106   Pulse 88   Temp 98.2 F (36.8 C) (Oral)   Resp 18   SpO2 100%   Physical Exam Vitals and nursing note reviewed.  HENT:     Head: Atraumatic.  Eyes:     General: No scleral icterus.       Right eye: No discharge.        Left eye: No discharge.     Extraocular Movements: Extraocular movements intact.     Pupils: Pupils are equal, round, and reactive to light.  Cardiovascular:     Rate and Rhythm:  Regular rhythm.  Pulmonary:     Breath sounds: No wheezing or rhonchi.  Abdominal:     Tenderness: There is no abdominal tenderness.  Musculoskeletal:     Cervical back: Neck supple.  Skin:    General: Skin is warm.     Capillary Refill: Capillary refill takes less than 2 seconds.  Neurological:     Mental Status: She is alert and oriented to person, place, and time.  Psychiatric:        Mood and Affect: Mood normal.     ED Results / Procedures / Treatments   Labs (all labs ordered are listed, but only abnormal results are displayed) Labs Reviewed  BASIC METABOLIC PANEL - Abnormal; Notable for the following components:      Result Value   Glucose, Bld 103 (*)    All other components within normal limits  CBC - Abnormal; Notable for the following components:   Hemoglobin 11.9 (*)    HCT 35.1 (*)    All other components within normal limits  GRAM STAIN  CSF CULTURE  GLUCOSE, CSF  PROTEIN, CSF  CSF CELL COUNT WITH DIFFERENTIAL  OLIGOCLONAL BANDS, CSF + SERM  DRAW EXTRA CLOT TUBE  I-STAT BETA HCG BLOOD, ED ([facility], [facility], [facility] ONLY)    EKG None  Radiology MR BRAIN WO CONTRAST  Result Date: 01/31/2020 CLINICAL DATA:  Vision change.  Rule out optic neuritis. EXAM: MRI HEAD AND ORBITS WITHOUT CONTRAST TECHNIQUE: Multiplanar, multiecho pulse sequences of the brain and surrounding structures were obtained without intravenous contrast. Multiplanar, multiecho pulse sequences of the orbits and surrounding structures were obtained including fat saturation techniques, without intravenous contrast administration. COMPARISON:  CT head 11/22/2019 FINDINGS: MRI HEAD FINDINGS Brain: Ventricle size and cerebral volume normal. Negative for acute infarct. Patchy hyperintensity throughout the cerebral white matter bilaterally. This is relatively symmetric. Brainstem and cerebellum normal. Negative for hemorrhage There is nodular thickening of the dura on the right in the frontal and parietal lobe. This  is best seen on coronal T2 and axial FLAIR images. No associated susceptibility. This appears to be unilateral. The sella is mildly enlarged. There is lobular pituitary tissue in the sella bilaterally without focal mass. No evidence of prior pituitary surgery. Vascular: Normal arterial flow voids Skull and upper cervical spine: No focal skeletal lesion. Other: None MRI ORBITS FINDINGS Orbits: Negative for orbital mass or edema. Optic nerve normal in signal morphology. Distended optic nerve sheaths may reflect increased intracranial pressure. No evidence of optic neuritis or mass. Extraocular muscles normal. Globe normal bilaterally. Optic chiasm normal bilaterally. Cavernous sinus normal bilaterally. Visualized sinuses: Negative Soft tissues: Negative The patient was anxious during the scan and terminated the study early. Intravenous contrast was not given. IMPRESSION: 1.  Lobular thickening of the dura in the right frontal and parietal lobe. 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 Recommend follow-up MRI brain with contrast to evaluate the extent of disease. 2. Moderate white matter changes. Favor chronic microvascular ischemia given history of hypertension and hypercholesterolemia. Demyelinating disease considered less likely. Negative for acute infarct. 3. Mild enlargement of the sella with lobular pituitary tissue bilaterally. No evidence of prior surgery. Correlate with pituitary hormone levels. 4. Distended optic nerve sheaths bilaterally suggesting increased intracranial pressure. Normal optic nerve bilaterally. 5. These results were called by telephone at the time of interpretation on 01/31/2020 at 2:47 pm to provider Benjiman Core , who verbally acknowledged these results. Electronically Signed   By: Marlan Palau M.D.   On: 01/31/2020 14:48   MR Venogram Head  Result Date: 01/31/2020 CLINICAL DATA:  Papilledema.  Vision change. EXAM: MR VENOGRAM OF THE HEAD WITHOUT CONTRAST TECHNIQUE: Angiographic images of the  intracranial venous structures were obtained using MRV technique without intravenous contrast. COMPARISON:  None. FINDINGS: The patient refused intravenous contrast in terminated the study early. Unenhanced MR venogram was obtained. Superior sagittal sinus appears patent throughout. 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 No definite thrombosis however intravenous contrast would be helpful for more detailed evaluation. Small jugular vein patent bilaterally. Straight sinus patent bilaterally. IMPRESSION: Unenhanced only MRV. Sagittal sinus widely patent. Hypoplastic transverse sinus bilaterally. Electronically Signed   By: Marlan Palau M.D.   On: 01/31/2020 14:50   MR ORBITS WO CONTRAST  Result Date: 01/31/2020 CLINICAL DATA:  Vision change.  Rule out optic neuritis. EXAM: MRI HEAD AND ORBITS WITHOUT CONTRAST TECHNIQUE: Multiplanar, multiecho pulse sequences of the brain and surrounding structures were obtained without intravenous contrast. Multiplanar, multiecho pulse sequences of the orbits and surrounding structures were obtained including fat saturation techniques, without intravenous contrast administration. COMPARISON:  CT head 11/22/2019 FINDINGS: MRI HEAD FINDINGS Brain: Ventricle size and cerebral volume normal. Negative for acute infarct. Patchy hyperintensity throughout the cerebral white matter bilaterally. This is relatively symmetric. Brainstem and cerebellum normal. Negative for hemorrhage There is nodular thickening of the dura on the right in the frontal and parietal lobe. This is best seen on coronal T2 and axial FLAIR images. No associated susceptibility. This appears to be unilateral. The sella is mildly enlarged. There is lobular pituitary tissue in the sella bilaterally without focal mass. No evidence of prior pituitary surgery. Vascular: Normal arterial flow voids Skull and upper cervical spine: No focal skeletal lesion. Other: None MRI ORBITS FINDINGS Orbits: Negative for  orbital mass or edema. Optic nerve normal in signal morphology. Distended optic nerve sheaths may reflect increased intracranial pressure. No evidence of optic neuritis or mass. Extraocular muscles normal. Globe normal bilaterally. Optic chiasm normal bilaterally. Cavernous sinus normal bilaterally. Visualized sinuses: Negative Soft tissues: Negative The patient was anxious during the scan and terminated the study early. Intravenous contrast was not given. IMPRESSION: 1. Lobular thickening of the dura in the right frontal and parietal lobe. 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 Recommend follow-up MRI brain with contrast to evaluate the extent of disease. 2. Moderate white matter changes. Favor chronic microvascular ischemia given history of hypertension and hypercholesterolemia. Demyelinating disease considered less likely. Negative for acute infarct. 3. Mild enlargement of the sella with lobular pituitary tissue bilaterally. No evidence of prior surgery. Correlate with pituitary hormone levels. 4. Distended optic nerve sheaths bilaterally suggesting increased intracranial pressure. Normal optic nerve bilaterally. 5. These results were called by telephone at the time of interpretation on 01/31/2020 at 2:47 pm to provider [facility] ,  who verbally acknowledged these results. Electronically Signed   By: Marlan Palauharles  Clark M.D.   On: 01/31/2020 14:48   DG Lumbar Puncture Fluoro Guide  Result Date: 01/31/2020 CLINICAL DATA:  Optic nerve edema. EXAM: DIAGNOSTIC LUMBAR PUNCTURE UNDER FLUOROSCOPIC GUIDANCE FLUOROSCOPY TIME:  Fluoroscopy Time:  30 seconds Radiation Exposure Index (if provided by the fluoroscopic device): 53.3 mGy Number of Acquired Spot Images: 1 PROCEDURE: Informed consent was obtained from the patient prior to the procedure, including potential complications of headache, allergy, and pain. With the patient prone, the lower back was prepped with Betadine. 1% Lidocaine was used for local anesthesia. Lumbar puncture  was performed at the L3-L4 level using a 5 inch 22 gauge needle with return of clear CSF with an opening pressure of 39 cm water. 12 ml of CSF were obtained for laboratory studies. The patient tolerated the procedure well. No immediate post-procedure complication. IMPRESSION: Successful fluoroscopically-guided L3-L4 lumbar puncture without immediate post-procedure complication. Opening pressure of 39 cm water. 12 mL CSF collected and sent for laboratory studies. Electronically Signed   By: Jackey LogeKyle  Golden DO   On: 01/31/2020 16:45    Procedures Procedures (including critical care time)  Medications Ordered in ED Medications  acetaminophen (TYLENOL) tablet 650 mg (650 mg Oral Given 01/31/20 1045)  LORazepam (ATIVAN) injection 1 mg (1 mg Intravenous Given 01/31/20 1315)    ED Course  I have reviewed the triage vital signs and the nursing notes.  Pertinent labs & imaging results that were available during my care of the patient were reviewed by me and considered in my medical decision making (see chart for details).    MDM Rules/Calculators/A&P                          Patient sent in for vision changes bilateral optic disc edema.  Ophthalmology had seen patient.  Sent in for MRI MRV.  Unable to tolerate MRI long enough to get contrast.  Does not think she will be able to manage it even with more medications at this time.  Will have outpatient follow-up though.  However is suspicious for IIH.  Lumbar puncture done by fluoroscopy and had opening pressure of 39.  With his elevated pressure will start on Diamox.  Will require some time on back in ER.  Lab work can be followed by Dr. Deretha EmoryZackowski at that time.  Patient will likely be discharged home. Final Clinical Impression(s) / ED Diagnoses Final diagnoses:  IIH (idiopathic intracranial hypertension)    Rx / DC Orders ED Discharge Orders         Ordered    acetaZOLAMIDE (DIAMOX SEQUELS) 500 MG capsule  2 times daily        01/31/20 1659            Benjiman CorePickering, Liza Czerwinski, MD 01/31/20 1702

## 2020-02-01 LAB — PATHOLOGIST SMEAR REVIEW

## 2020-02-04 ENCOUNTER — Encounter: Payer: Self-pay | Admitting: Neurology

## 2020-02-04 LAB — CSF CULTURE W GRAM STAIN: Culture: NO GROWTH

## 2020-02-06 LAB — OLIGOCLONAL BANDS, CSF + SERM

## 2020-02-25 ENCOUNTER — Ambulatory Visit (INDEPENDENT_AMBULATORY_CARE_PROVIDER_SITE_OTHER): Payer: Medicaid Other | Admitting: Neurology

## 2020-02-25 ENCOUNTER — Encounter: Payer: Self-pay | Admitting: Neurology

## 2020-02-25 ENCOUNTER — Other Ambulatory Visit: Payer: Self-pay

## 2020-02-25 VITALS — BP 165/104 | HR 105 | Ht 65.0 in | Wt 254.6 lb

## 2020-02-25 DIAGNOSIS — I10 Essential (primary) hypertension: Secondary | ICD-10-CM

## 2020-02-25 DIAGNOSIS — G932 Benign intracranial hypertension: Secondary | ICD-10-CM | POA: Diagnosis not present

## 2020-02-25 DIAGNOSIS — E669 Obesity, unspecified: Secondary | ICD-10-CM | POA: Diagnosis not present

## 2020-02-25 NOTE — Patient Instructions (Signed)
1.  Continue acetazolamide 500mg  twice daily 2.  Follow up with Dr. at [facility] in a couple of weeks 3.  Follow up in 6 months.

## 2020-02-25 NOTE — Progress Notes (Signed)
NEUROLOGY CONSULTATION NOTE  Gail Osborne MRN: [id number] DOB: [birth year]  Referring provider: Vanetta Mulders, MD (ED referral) Primary care provider: [facility]  Reason for consult:  Idiopathic intracranial hypertension   Subjective:  Gail Osborne is a 44 year old right-handed female with HTN and hypothyroidism who presents for idiopathic intracranial hypertension.  History supplemented by ED notes.  MRI brain/orbits and MRV of head personally reviewed.   In November, she had a routine eye exam to get new glasses prescription.  On 01/31/2020, she was evaluated by Dr. Georges Mouse of ophthalmology who noted bilateral optic disc edema, right worse than left.  She was sent to the [facility] ED for MRI.  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  Lumbar puncture demonstrated opening pressure of 39 mL H2O.  She was started on acetazolamide 500mg  twice daily.  She has a headache once in awhile but nothing significant.  She sometimes has blurred vision in her right eye but no visual obscurations.  No pulsatile tinnitus.  She was diagnosed with hypothyroidism a couple of years ago and had gained about 80 lbs.  She is not on oral contraceptive pills.       01/31/2020 MRI BRAIN & ORBITS WO:  1. Lobular thickening of the dura in the right frontal and parietal lobe. 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 Recommend follow-up MRI brain with contrast to evaluate the extent of disease.  2. Moderate white matter changes. Favor chronic microvascular ischemia given history of hypertension and hypercholesterolemia. Demyelinating disease considered less likely. Negative for acute  infarct. 3. Mild enlargement of the sella with lobular pituitary tissue bilaterally. No evidence of prior surgery. Correlate with pituitary hormone levels. 4. Distended optic nerve sheaths bilaterally suggesting increased intracranial pressure. Normal optic nerve bilaterally. 01/31/2020 MRV HEAD WO:  Unenhanced only MRV. Sagittal sinus widely patent. Hypoplastic transverse sinus bilaterally.  PAST MEDICAL HISTORY: Past Medical History:  Diagnosis Date  . Depression   . Hypertension   . Obesity   . Thyroid disease   . Vitamin D deficiency     PAST SURGICAL HISTORY: Past Surgical History:  Procedure Laterality Date  . ABDOMINAL HYSTERECTOMY     partial  . CESAREAN SECTION      MEDICATIONS: Current Outpatient Medications on File Prior to Visit  Medication Sig Dispense Refill  . acetaZOLAMIDE (DIAMOX SEQUELS) 500 MG capsule Take 1 capsule (500 mg total) by mouth 2 (two) times daily. 60 capsule 0  . cephALEXin (KEFLEX) 500 MG capsule Take 1 capsule (500 mg total) by mouth 3 (three) times daily. (Patient not taking: Reported on 11/23/2019) 21 capsule 0  . ibuprofen (ADVIL) 200 MG tablet Take 200 mg by mouth every 6 (six) hours as needed for fever, headache or mild pain.    01/23/2020 levothyroxine (SYNTHROID) 112 MCG tablet Take 1 tablet (112 mcg total) by mouth daily. 30 tablet 2  . lisinopril (ZESTRIL) 10 MG tablet Take 1 tablet (10 mg total) by mouth daily. 30 tablet 2  . Vitamin D, Ergocalciferol, (DRISDOL) 1.25 MG (50000 UNIT) CAPS capsule Take 50,000 Units by mouth once a week.     No current facility-administered medications on file prior to visit.    ALLERGIES: No Known Allergies  FAMILY HISTORY: Family History  Problem Relation Age of Onset  . Hyperlipidemia Mother   . Depression Mother   . Anxiety disorder Mother   . Diabetes Father    SOCIAL HISTORY: Social History   Socioeconomic History  . Marital status: Single    Spouse name: Not on file  . Number of children: Not on  file  . Years of education: Not on file  . Highest education level: Not on file  Occupational History  . Not on file  Tobacco Use  . Smoking status: Never Smoker  . Smokeless tobacco: Never Used  Vaping Use  . Vaping Use: Never used  Substance and Sexual Activity  . Alcohol use: Not Currently  . Drug use: Never  . Sexual activity: Not on file  Other Topics Concern  . Not on file  Social History Narrative  . Not on file   Social Determinants of Health   Financial Resource Strain:   . Difficulty of Paying Living Expenses: Not on file  Food Insecurity:   . Worried About Programme researcher, broadcasting/film/video in the Last Year: Not on file  . Ran Out of Food in the Last Year: Not on file  Transportation Needs:   . Lack of Transportation (Medical): Not on file  . Lack of Transportation (Non-Medical): Not on file  Physical Activity:   . Days of Exercise per Week: Not on file  . Minutes of Exercise per Session: Not on file  Stress:   . Feeling of Stress : Not on file  Social Connections:   . Frequency of Communication with Friends and Family: Not on file  . Frequency of Social Gatherings with Friends and Family: Not on file  . Attends Religious Services: Not on file  . Active Member of Clubs or Organizations: Not on file  . Attends Banker Meetings: Not on file  . Marital Status: Not on file  Intimate Partner Violence:   . Fear of Current or Ex-Partner: Not on file  . Emotionally Abused: Not on file  . Physically Abused: Not on file  . Sexually Abused: Not on file    Objective:  Blood pressure (!) 157/110, pulse (!) 105, height 5\' 5"  (1.651 m), weight 254 lb 9.6 oz (115.5 kg), SpO2 95 %. General: No acute distress.  Patient appears well-groomed.   Head:  Normocephalic/atraumatic Eyes:  fundi examined but not visualized Neck: supple, no paraspinal tenderness, full range of motion Back: No paraspinal tenderness Heart: regular rate and rhythm Lungs: Clear to auscultation  bilaterally. Vascular: No carotid bruits. Neurological Exam: Mental status: alert and oriented to person, place, and time, recent and remote memory intact, fund of knowledge intact, attention and concentration intact, speech fluent and not dysarthric, language intact. Cranial nerves: CN I: not tested CN II: pupils equal, round and reactive to light, visual fields intact CN III, IV, VI:  full range of motion, no nystagmus, no ptosis CN V: facial sensation intact. CN VII: upper and lower face symmetric CN VIII: hearing intact CN IX, X: gag intact, uvula midline CN XI: sternocleidomastoid and trapezius muscles intact CN XII: tongue midline Bulk & Tone: normal, no fasciculations. Motor:  muscle strength 5/5 throughout Sensation:  Pinprick, temperature and vibratory sensation intact. Deep Tendon Reflexes:  2+ throughout,  toes downgoing.   Finger to nose testing:  Without dysmetria.   Heel to shin:  Without dysmetria.   Gait:  Normal station and stride.  Romberg negative.  Assessment/Plan:   Idiopathic intracranial  hypertension Obesity Hypertension  1.  Acetazolamide 500mg  twice daily 2.  She has a follow up with ophthalmology in the next couple of weeks.  We will make medication adjustments as needed. 3.  Weight loss is an important aspect of treatment.  I will refer her to the [facility]. 4.  Follow up with PCP regarding elevated blood pressure 5.  Follow up with endocrinology regarding pituitary workup 6.  Follow up with me in 6 months.    Thank you for allowing me to take part in the care of this patient.  [facility], DO

## 2020-04-18 ENCOUNTER — Ambulatory Visit: Payer: Medicaid Other | Admitting: Neurology

## 2020-05-08 ENCOUNTER — Ambulatory Visit (INDEPENDENT_AMBULATORY_CARE_PROVIDER_SITE_OTHER): Payer: Medicaid Other | Admitting: Family Medicine

## 2020-05-22 ENCOUNTER — Ambulatory Visit (INDEPENDENT_AMBULATORY_CARE_PROVIDER_SITE_OTHER): Payer: Medicaid Other | Admitting: Family Medicine

## 2020-08-26 NOTE — Progress Notes (Deleted)
NEUROLOGY FOLLOW UP OFFICE NOTE  Gail Osborne [id number]  Assessment/Plan:   ***  Subjective:  Gail Osborne is a 45 year old right-handed female with HTN and hypothyroidism who follows up for idiopathic intracranial hypertension.   UPDATE: Currently taking acetazolamide 500mg  BID. She followed up with ophthalmology on 03/05/2020.  Disc edema improved but still present.  ***  HISTORY: In November 2021, she had a routine eye exam to get new glasses prescription.  On 01/31/2020, she was evaluated by Dr. 13/01/2020 of ophthalmology who noted bilateral optic disc edema, right worse than left.  She was sent to the [facility] ED for MRI.  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  Lumbar puncture demonstrated opening pressure of 39 mL H2O.  She was started on acetazolamide 500mg  twice daily.  She has a headache once in awhile but nothing significant.  She sometimes has blurred vision in her right eye but no visual obscurations.  No pulsatile tinnitus.  She was diagnosed with hypothyroidism a couple of years ago and had gained about 80 lbs.  She is not on oral contraceptive pills.      01/31/2020 MRI BRAIN & ORBITS WO:  1. Lobular thickening of the dura in the right frontal and parietal lobe. 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 Recommend follow-up MRI brain with contrast to evaluate the extent of disease.  2. Moderate white matter changes. Favor chronic microvascular ischemia given history of hypertension and hypercholesterolemia. Demyelinating disease considered less likely. Negative for acute infarct. 3. Mild enlargement of the sella with lobular pituitary tissue bilaterally.  No evidence of prior surgery. Correlate with pituitary hormone levels. 4. Distended optic nerve sheaths bilaterally suggesting increased intracranial pressure. Normal optic nerve bilaterally. 01/31/2020 MRV HEAD WO:  Unenhanced only MRV. Sagittal sinus widely patent. Hypoplastic transverse sinus bilaterally.  PAST MEDICAL HISTORY: Past Medical History:  Diagnosis Date  . Depression   . Hypertension   . Obesity   . Thyroid disease   . Vitamin D deficiency     MEDICATIONS: Current Outpatient Medications on File Prior to Visit  Medication Sig Dispense Refill  . acetaZOLAMIDE (DIAMOX SEQUELS) 500 MG capsule Take 1 capsule (500 mg total) by mouth 2 (two) times daily. 60 capsule 0  . cephALEXin (KEFLEX) 500 MG capsule Take 1 capsule (500 mg total) by mouth 3 (three) times daily. (Patient not taking: Reported on 11/23/2019) 21 capsule 0  . ibuprofen (ADVIL) 200 MG tablet Take 200 mg by mouth every 6 (six) hours as needed for fever, headache or mild pain.    13/01/2020 levothyroxine (SYNTHROID) 112 MCG tablet Take 1 tablet (112 mcg total) by mouth daily. (Patient not taking: Reported on 02/25/2020) 30 tablet 2  . lisinopril (ZESTRIL) 10 MG tablet Take 1 tablet (10 mg total) by mouth daily. (Patient not taking: Reported on 02/25/2020) 30 tablet 2  . Vitamin D, Ergocalciferol, (DRISDOL) 1.25 MG (50000 UNIT) CAPS capsule Take 50,000 Units by mouth once a week.     No current facility-administered medications on file prior to visit.    ALLERGIES: No Known Allergies  FAMILY HISTORY: Family History  Problem Relation Age of Onset  . Hyperlipidemia Mother   . Depression Mother   . Anxiety disorder  Mother   . Diabetes Father       Objective:  *** General: No acute distress.  Patient appears ***-groomed.   Head:  Normocephalic/atraumatic Eyes:  Fundi examined but not visualized Neck: supple, no paraspinal tenderness, full range of motion Heart:  Regular rate and rhythm Lungs:  Clear to auscultation  bilaterally Back: No paraspinal tenderness Neurological Exam: alert and oriented to person, place, and time. Attention span and concentration intact, recent and remote memory intact, fund of knowledge intact.  Speech fluent and not dysarthric, language intact.  CN II-XII intact. Bulk and tone normal, muscle strength 5/5 throughout.  Sensation to light touch, temperature and vibration intact.  Deep tendon reflexes 2+ throughout, toes downgoing.  Finger to nose and heel to shin testing intact.  Gait normal, Romberg negative.     Shon Millet, DO  CC: ***

## 2020-08-28 ENCOUNTER — Ambulatory Visit: Payer: Medicaid Other | Admitting: Neurology

## 2021-07-06 IMAGING — CR DG CHEST 2V
2 series · 2 of 2 positions shown · non-contrast
Comparison: None

CLINICAL DATA: New onset shortness of breath this morning,
weakness, history hypertension

EXAM:
CHEST - 2 VIEW

[w chest pa]
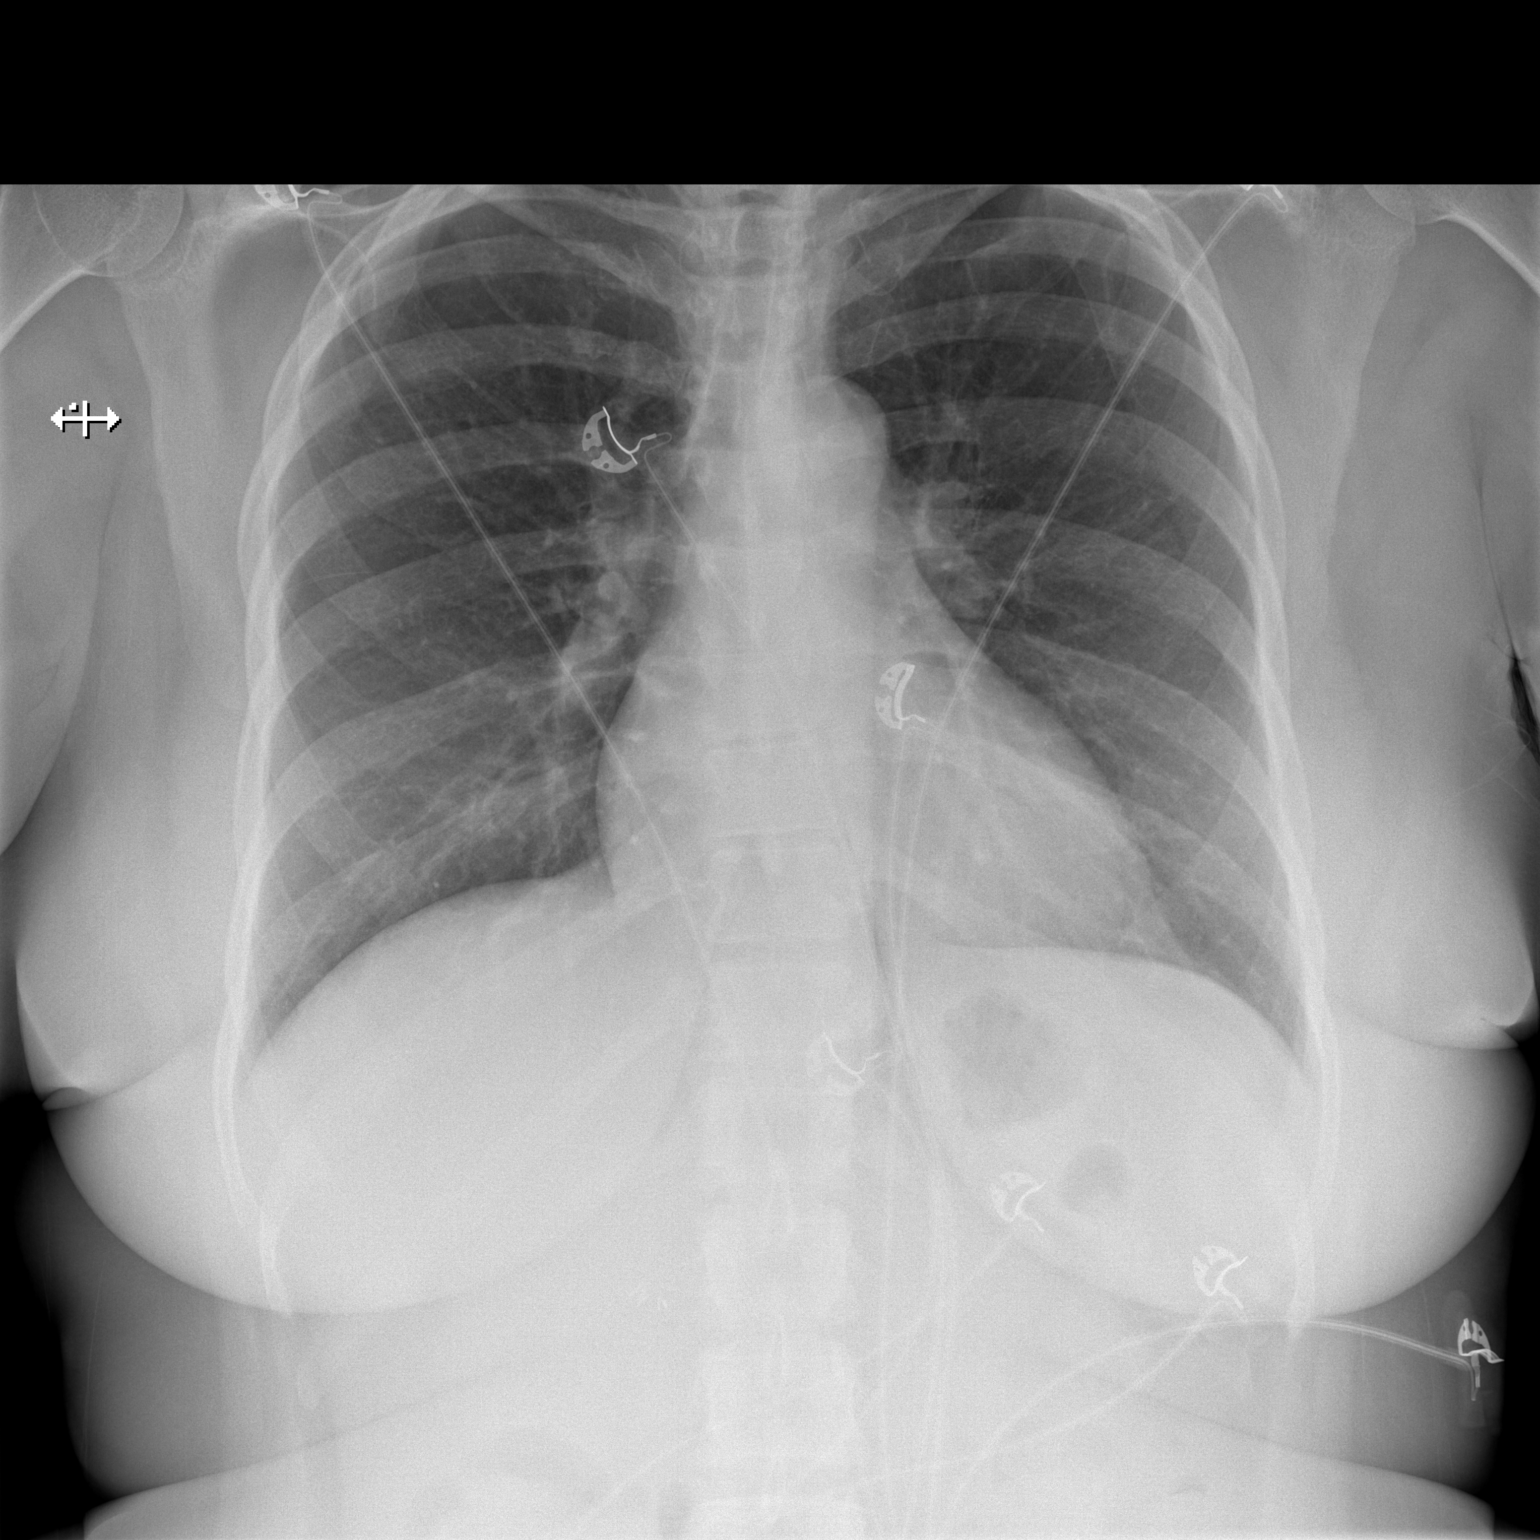

[w chest lat]
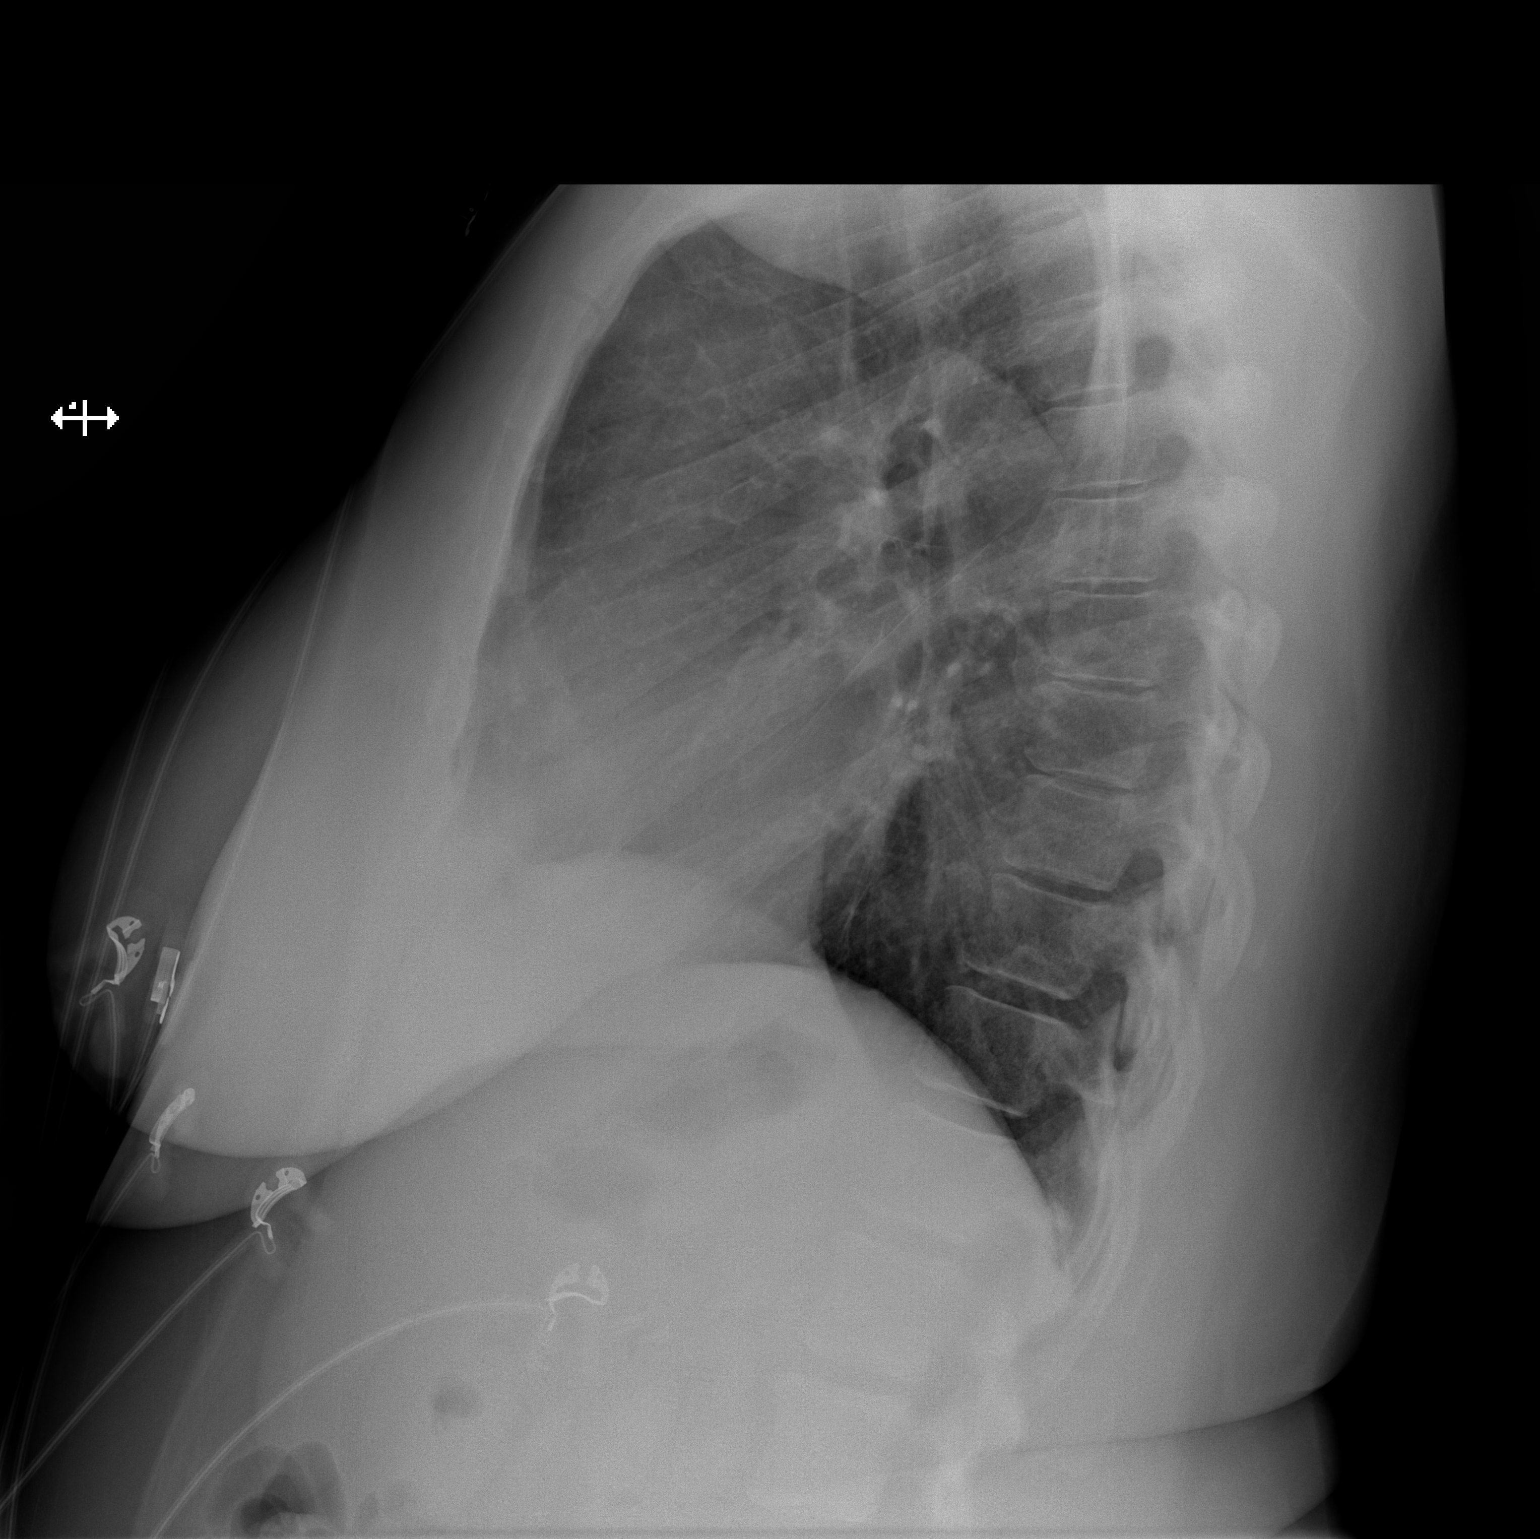

[2 of 2 positions shown; findings below may reference images not displayed]

FINDINGS: Normal heart size, mediastinal contours, and pulmonary vascularity.

Lungs clear.

No pleural effusion or pneumothorax.

Bones unremarkable.
IMPRESSION: Normal exam.
